# Patient Record
Sex: Male | Born: 1975 | Hispanic: Yes | Marital: Married
Health system: Southern US, Community
[De-identification: ages and names within clinical notes are randomized; demographics above are authoritative.]

---

## 2014-10-11 ENCOUNTER — Other Ambulatory Visit: Payer: Self-pay | Admitting: Neurosurgery

## 2014-10-11 DIAGNOSIS — M5137 Other intervertebral disc degeneration, lumbosacral region: Secondary | ICD-10-CM

## 2014-10-20 ENCOUNTER — Ambulatory Visit
Admission: RE | Admit: 2014-10-20 | Discharge: 2014-10-20 | Disposition: A | Discharge status: Home / Self Care | Payer: Worker's Compensation | Source: Ambulatory Visit | Attending: Neurosurgery | Admitting: Neurosurgery

## 2014-10-20 ENCOUNTER — Other Ambulatory Visit: Payer: Self-pay | Admitting: Neurosurgery

## 2014-10-20 ENCOUNTER — Inpatient Hospital Stay
Admission: RE | Admit: 2014-10-20 | Discharge: 2014-10-20 | Disposition: A | Discharge status: Home / Self Care | Payer: Self-pay | Source: Ambulatory Visit | Attending: Neurosurgery | Admitting: Neurosurgery

## 2014-10-20 DIAGNOSIS — M51379 Other intervertebral disc degeneration, lumbosacral region without mention of lumbar back pain or lower extremity pain: Secondary | ICD-10-CM

## 2014-10-20 DIAGNOSIS — M5137 Other intervertebral disc degeneration, lumbosacral region: Secondary | ICD-10-CM

## 2014-10-20 MED ORDER — IOHEXOL 180 MG/ML  SOLN
18.0000 mL | Freq: Once | INTRAMUSCULAR | Status: DC | PRN
  Administered 2014-10-20: 18 mL via INTRATHECAL

## 2014-10-20 MED ORDER — DIAZEPAM 5 MG PO TABS
10.0000 mg | ORAL_TABLET | Freq: Once | ORAL | Status: AC
  Administered 2014-10-20: 10 mg via ORAL

## 2014-10-20 NOTE — Discharge Instructions (Signed)

## 2016-09-03 IMAGING — CR DG MYELOGRAPHY LUMBAR INJ LUMBOSACRAL
3 series · 3 of 3 positions shown · non-contrast
Comparison: None

CLINICAL DATA: Spondylosis without myelopathy. Degenerative disc
disease L4-5 and L5-S1. Low back pain. Bilateral leg pain.
TECHNIQUE: Contiguous axial images were obtained through the Lumbar spine after
the intrathecal infusion of infusion. Coronal and sagittal
reconstructions were obtained of the axial image sets.

[w lumbar spine lat]
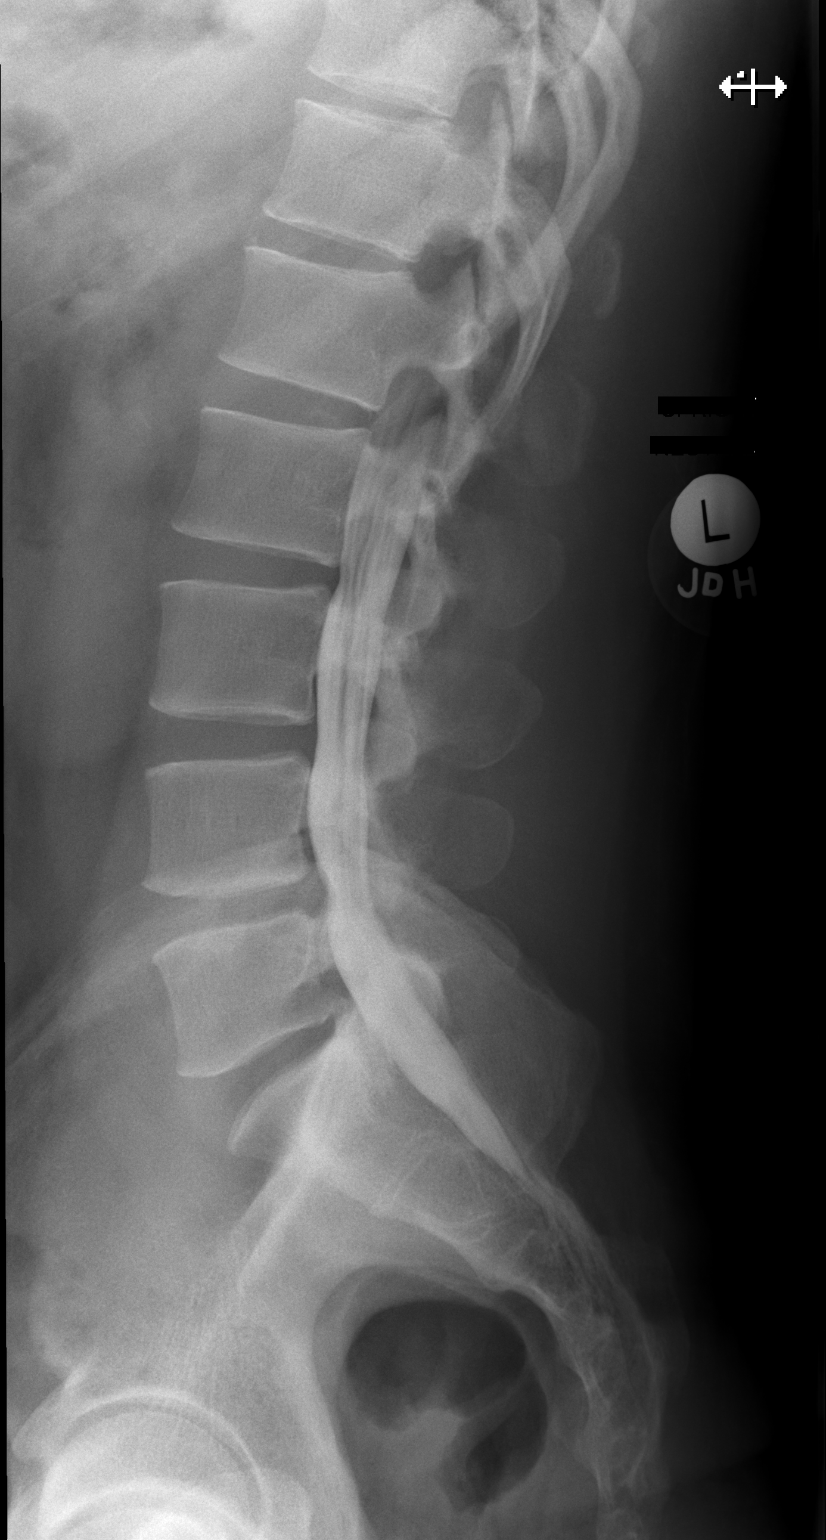

[w lumbar spine flexion]
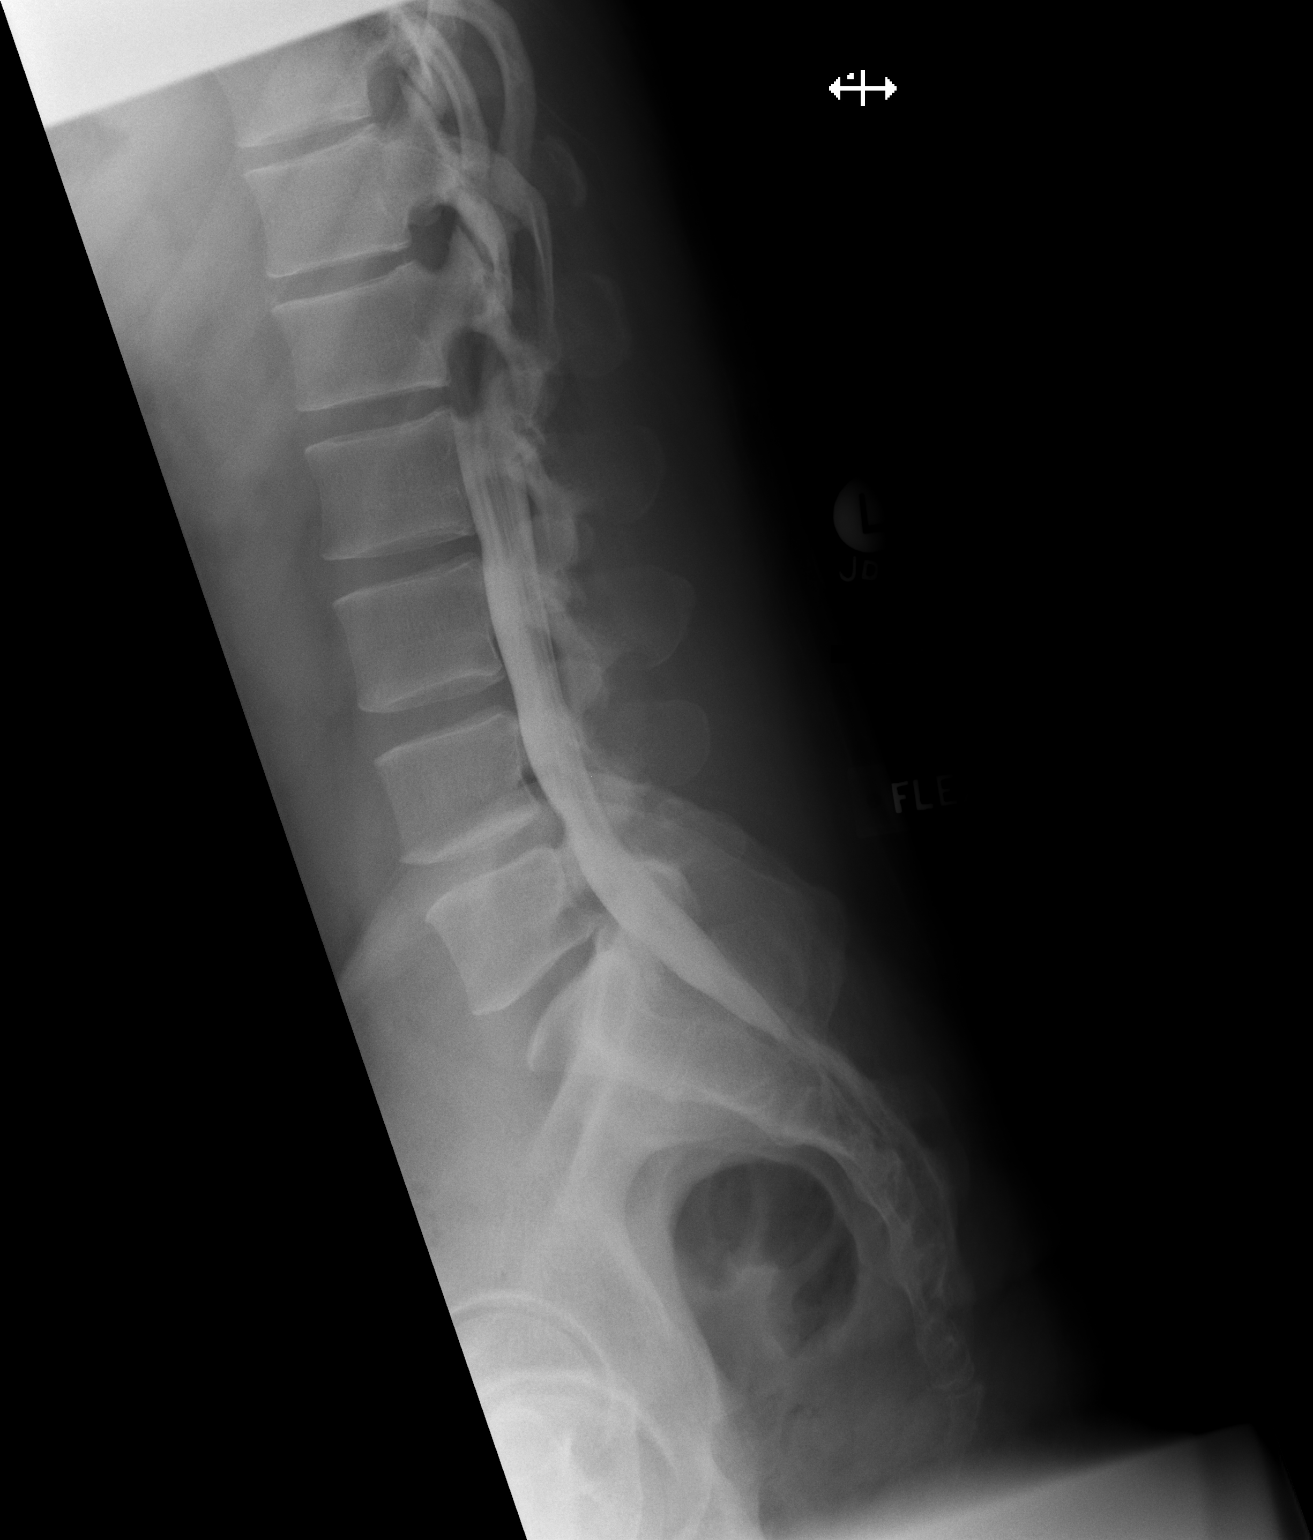

[w lumbar spine extension]
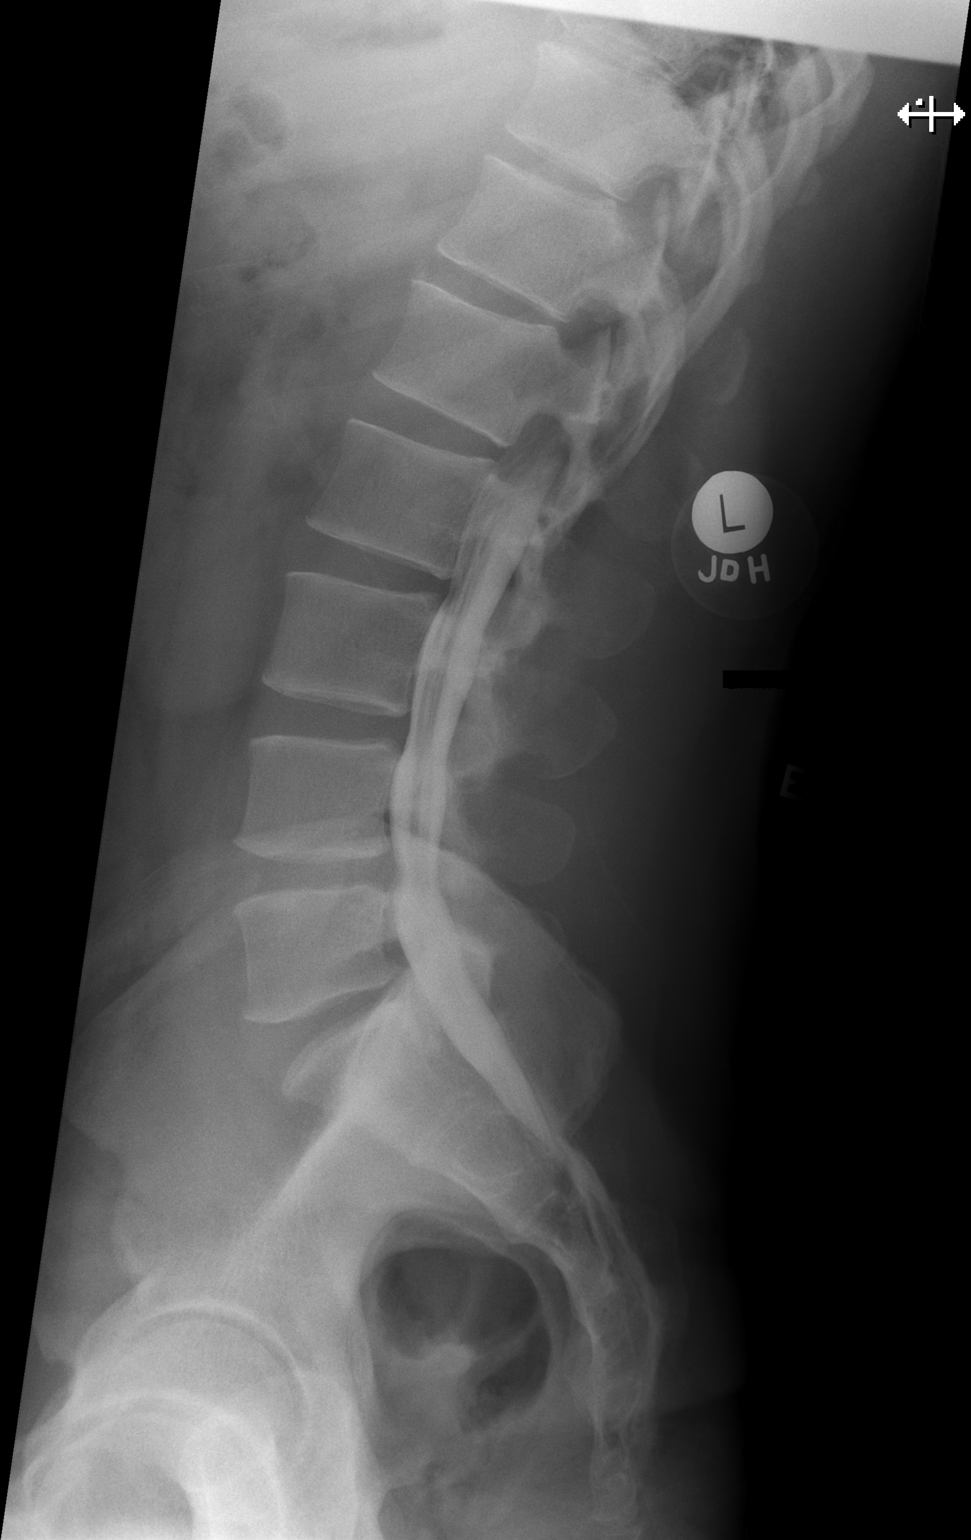

[3 of 3 positions shown; findings below may reference images not displayed]

EXAM:
LUMBAR MYELOGRAM

FLUOROSCOPY TIME:  0 minutes 40 seconds.  Six images.

PROCEDURE:
After thorough discussion of risks and benefits of the procedure
including bleeding, infection, injury to nerves, blood vessels,
adjacent structures as well as headache and CSF leak, written and
oral informed consent was obtained. Consent was obtained by Dr. Yap
Vani. Time out form was completed.

Patient was positioned prone on the fluoroscopy table. Local
anesthesia was provided with 1% lidocaine without epinephrine after
prepped and draped in the usual sterile fashion. Puncture was
performed at L3-4 using a 3 1/2 inch 22-gauge spinal needle via
right para median approach. Using a single pass through the dura,
the needle was placed within the thecal sac, with return of clear
CSF. 15 mL of [id number] was injected into the thecal sac, with
normal opacification of the nerve roots and cauda equina consistent
with free flow within the subarachnoid space.

I personally performed the lumbar puncture and administered the
intrathecal contrast. I also personally performed acquisition of the
myelogram images.
FINDINGS: LUMBAR MYELOGRAM FINDINGS:

No abnormality at L3-4 or above. At L4-5, there is mild narrowing of
the lateral recesses and a small anterior extradural defect. At
L5-S1, there is slightly diminished filling of the S1 root sleeves
but no severe stenosis.

Standing lateral flexion extension views do not show any abnormal
motion.

CT LUMBAR MYELOGRAM FINDINGS:

There is no abnormality at L3-4 or above. The discs are normal. The
canal and foramina are widely patent. The distal cord and conus are
normal.

L4-5: Disc degeneration with shallow broad-based protrusion of disc
material. Slight indentation of the thecal sac and mild stenosis of
the lateral recesses.

L5-S1: Disc degeneration with broad-based disc protrusion that
contacts the thecal sac in the S1 nerve roots as they [place] from the
thecal sac. Definite neural compression is not demonstrated.

No visible facet arthropathy. No pars defect or other focal bone
finding.
IMPRESSION: Normal at L3-4 and above.

Disc protrusions at L4-5 and L5-S1 that contact the thecal sac and
the nerve roots. Mild stenosis of the lateral recesses at these 2
levels that would have potential to cause neural irritation. Severe
stenosis or definite neural compression is not demonstrated.
